# Patient Record
Sex: Male | Born: 1935 | Race: White | Hispanic: No | Marital: Married | Smoking: Never smoker
Health system: Southern US, Community
[De-identification: ages and names within clinical notes are randomized; demographics above are authoritative.]

## PROBLEM LIST (undated history)

## (undated) DIAGNOSIS — K219 Gastro-esophageal reflux disease without esophagitis: Secondary | ICD-10-CM

## (undated) DIAGNOSIS — I4891 Unspecified atrial fibrillation: Secondary | ICD-10-CM

## (undated) DIAGNOSIS — R600 Localized edema: Secondary | ICD-10-CM

## (undated) DIAGNOSIS — I429 Cardiomyopathy, unspecified: Secondary | ICD-10-CM

## (undated) DIAGNOSIS — E785 Hyperlipidemia, unspecified: Secondary | ICD-10-CM

## (undated) DIAGNOSIS — E119 Type 2 diabetes mellitus without complications: Secondary | ICD-10-CM

## (undated) DIAGNOSIS — F0390 Unspecified dementia without behavioral disturbance: Secondary | ICD-10-CM

## (undated) DIAGNOSIS — G20 Parkinson's disease: Secondary | ICD-10-CM

## (undated) DIAGNOSIS — I10 Essential (primary) hypertension: Secondary | ICD-10-CM

## (undated) HISTORY — DX: Localized edema: R60.0

## (undated) HISTORY — DX: Gastro-esophageal reflux disease without esophagitis: K21.9

## (undated) HISTORY — DX: Parkinson's disease: G20

## (undated) HISTORY — PX: OTHER SURGICAL HISTORY: SHX169

## (undated) HISTORY — DX: Hyperlipidemia, unspecified: E78.5

## (undated) HISTORY — PX: CATARACT EXTRACTION: SUR2

## (undated) HISTORY — PX: TOTAL KNEE ARTHROPLASTY: SHX125

## (undated) HISTORY — DX: Essential (primary) hypertension: I10

## (undated) HISTORY — DX: Type 2 diabetes mellitus without complications: E11.9

## (undated) HISTORY — DX: Cardiomyopathy, unspecified: I42.9

## (undated) HISTORY — DX: Unspecified atrial fibrillation: I48.91

---

## 2007-05-31 ENCOUNTER — Ambulatory Visit: Payer: Self-pay | Admitting: Emergency Medicine

## 2008-02-26 ENCOUNTER — Ambulatory Visit: Payer: Self-pay | Admitting: Family Medicine

## 2008-02-26 ENCOUNTER — Other Ambulatory Visit: Payer: Self-pay

## 2008-02-29 ENCOUNTER — Other Ambulatory Visit: Payer: Self-pay

## 2008-02-29 ENCOUNTER — Inpatient Hospital Stay: Payer: Self-pay | Admitting: Internal Medicine

## 2008-06-06 ENCOUNTER — Ambulatory Visit: Payer: Self-pay | Admitting: Cardiovascular Disease

## 2008-06-06 ENCOUNTER — Encounter (INDEPENDENT_AMBULATORY_CARE_PROVIDER_SITE_OTHER): Payer: Self-pay | Admitting: Neurosurgery

## 2008-06-06 ENCOUNTER — Inpatient Hospital Stay (HOSPITAL_COMMUNITY): Admission: RE | Admit: 2008-06-06 | Discharge: 2008-06-08 | Payer: Self-pay | Admitting: Neurosurgery

## 2008-10-14 ENCOUNTER — Encounter: Admission: RE | Admit: 2008-10-14 | Discharge: 2008-10-14 | Payer: Self-pay | Admitting: Neurosurgery

## 2009-06-05 IMAGING — CR DG CERVICAL SPINE 2 OR 3 VIEWS
3 series · 3 of 3 positions shown · non-contrast
Comparison: None

CLINICAL DATA: History of C7-T1 fusion, follow-up

CERVICAL SPINE - 2-3 VIEW

[w c-spine lat]
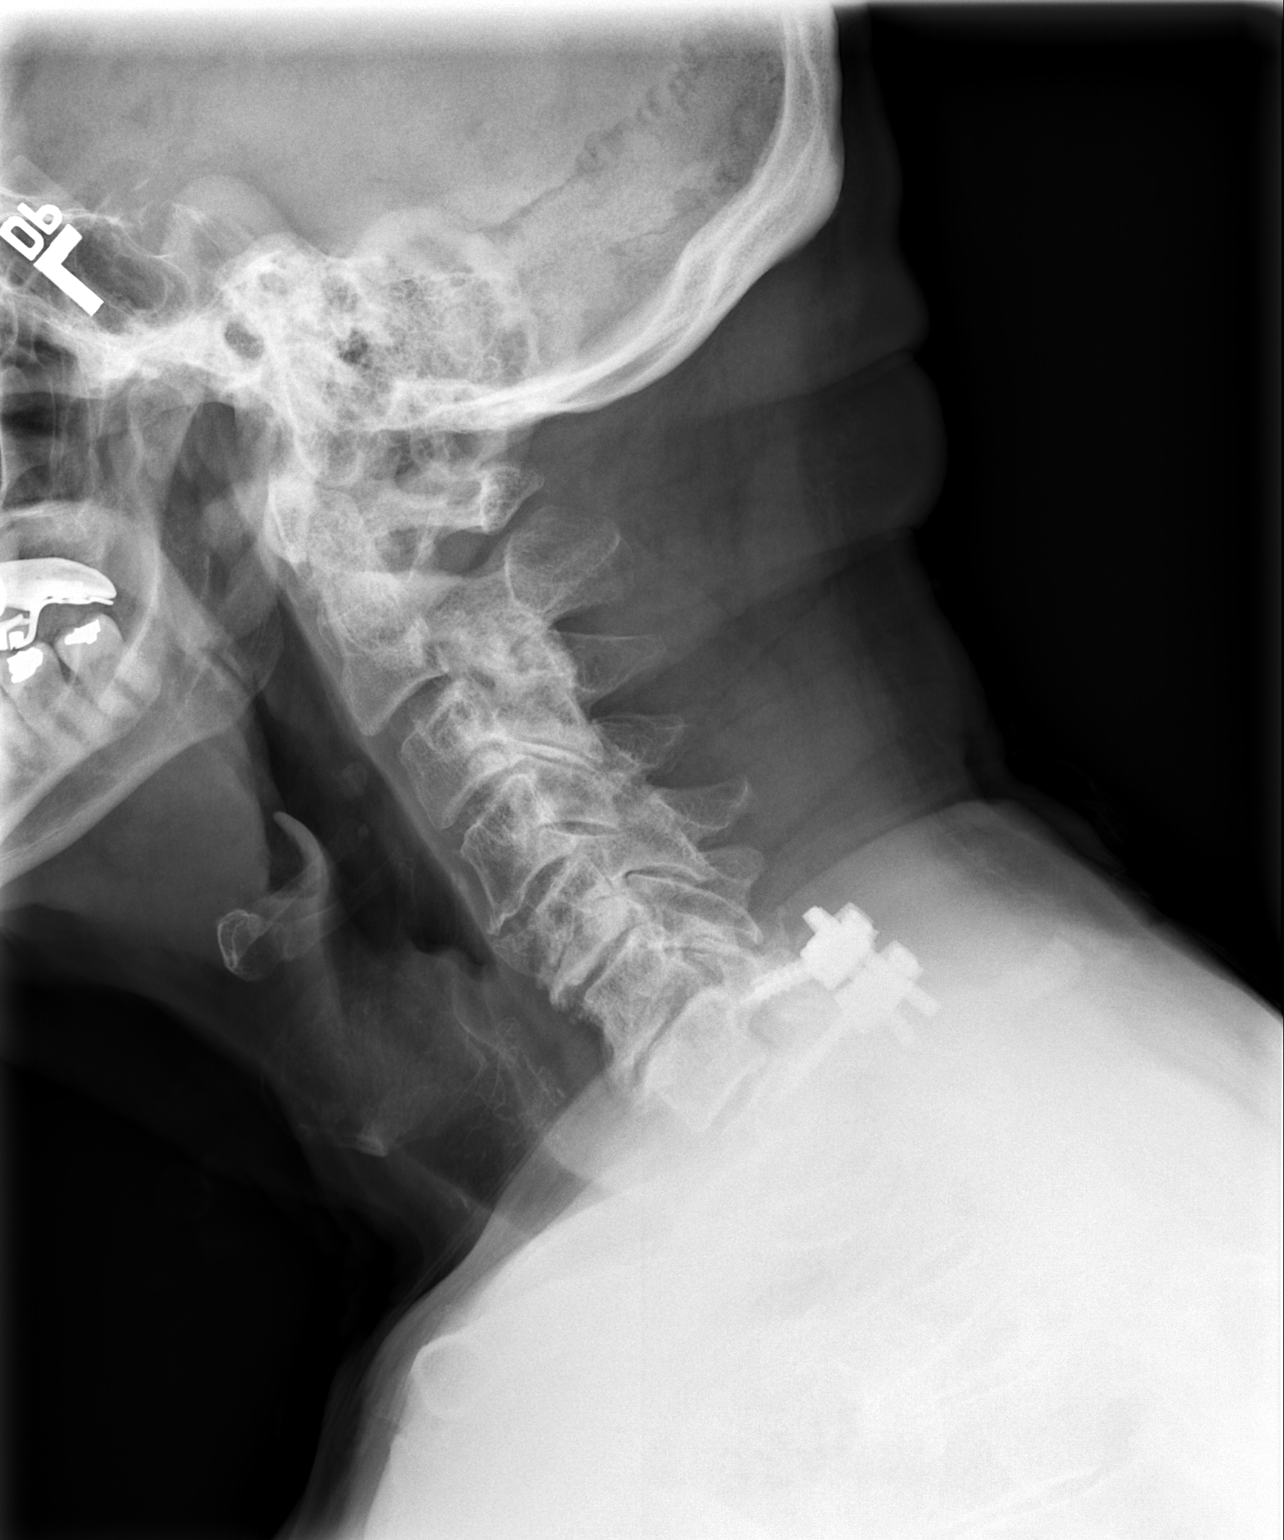

[w swimmers view *]
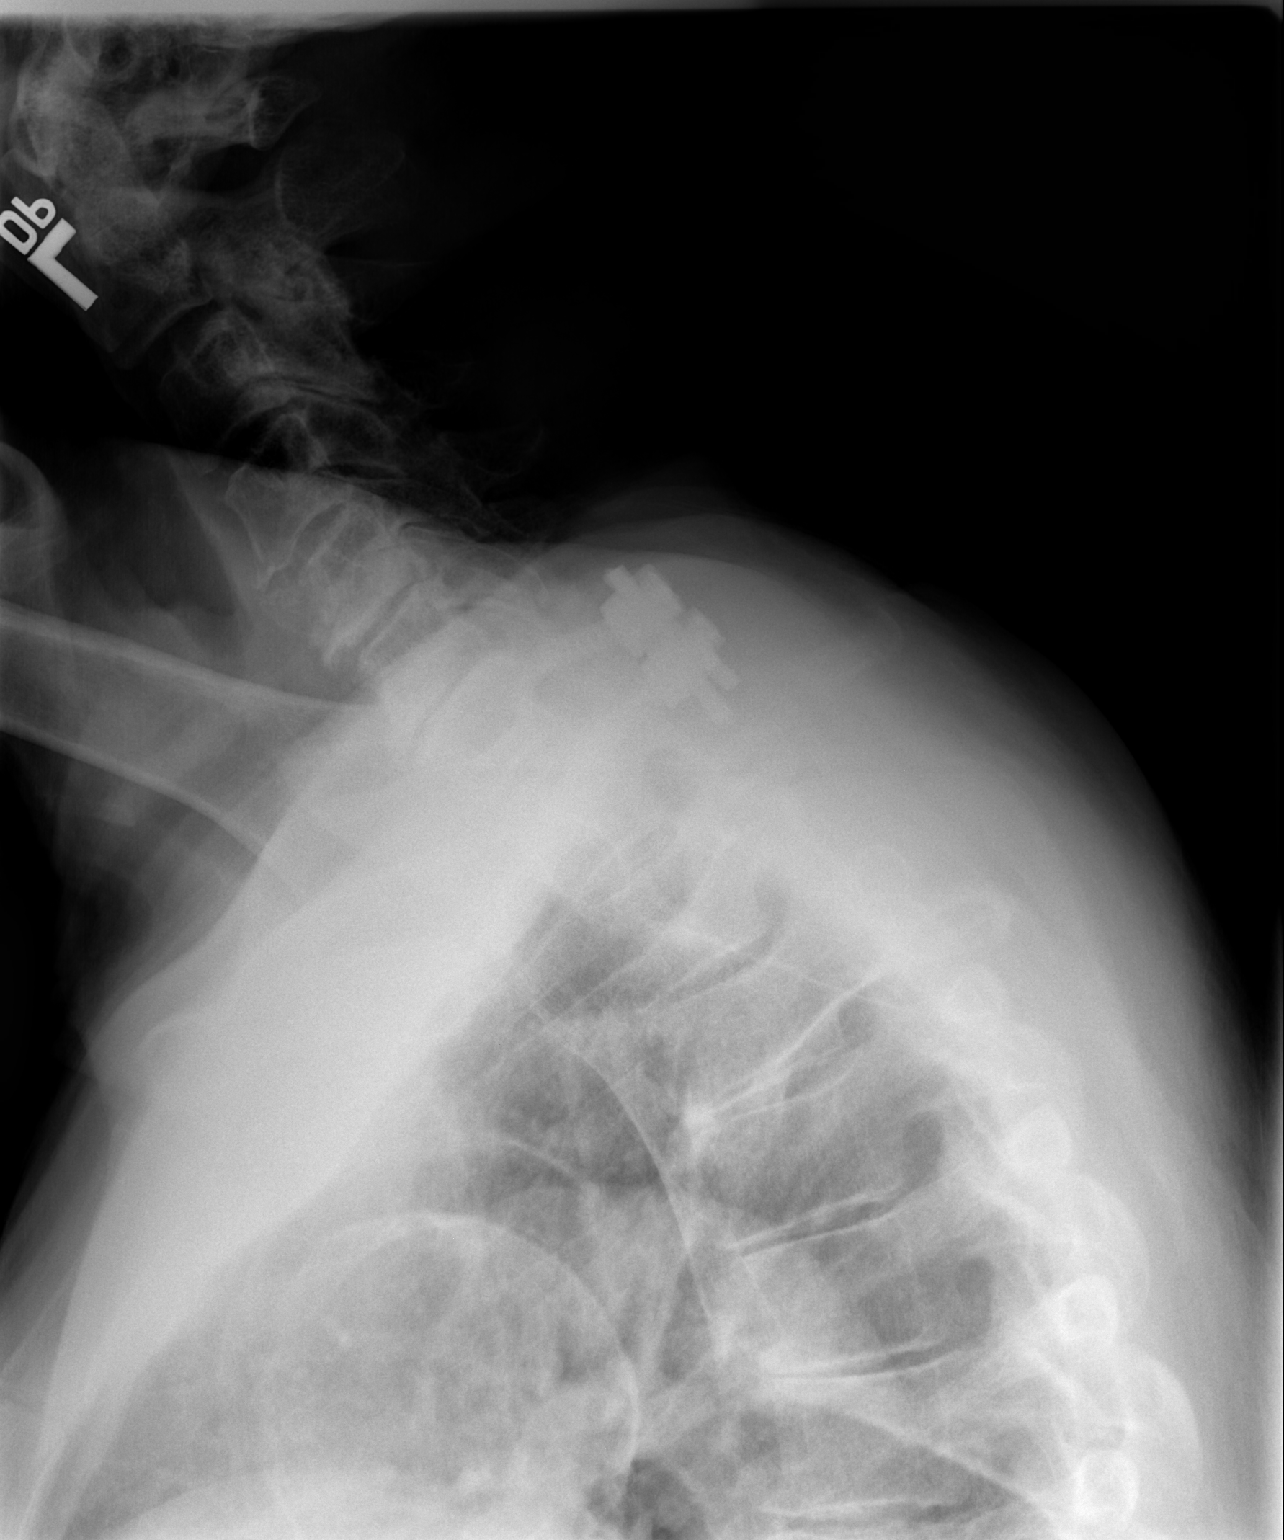

[w c-spine a.p. *]
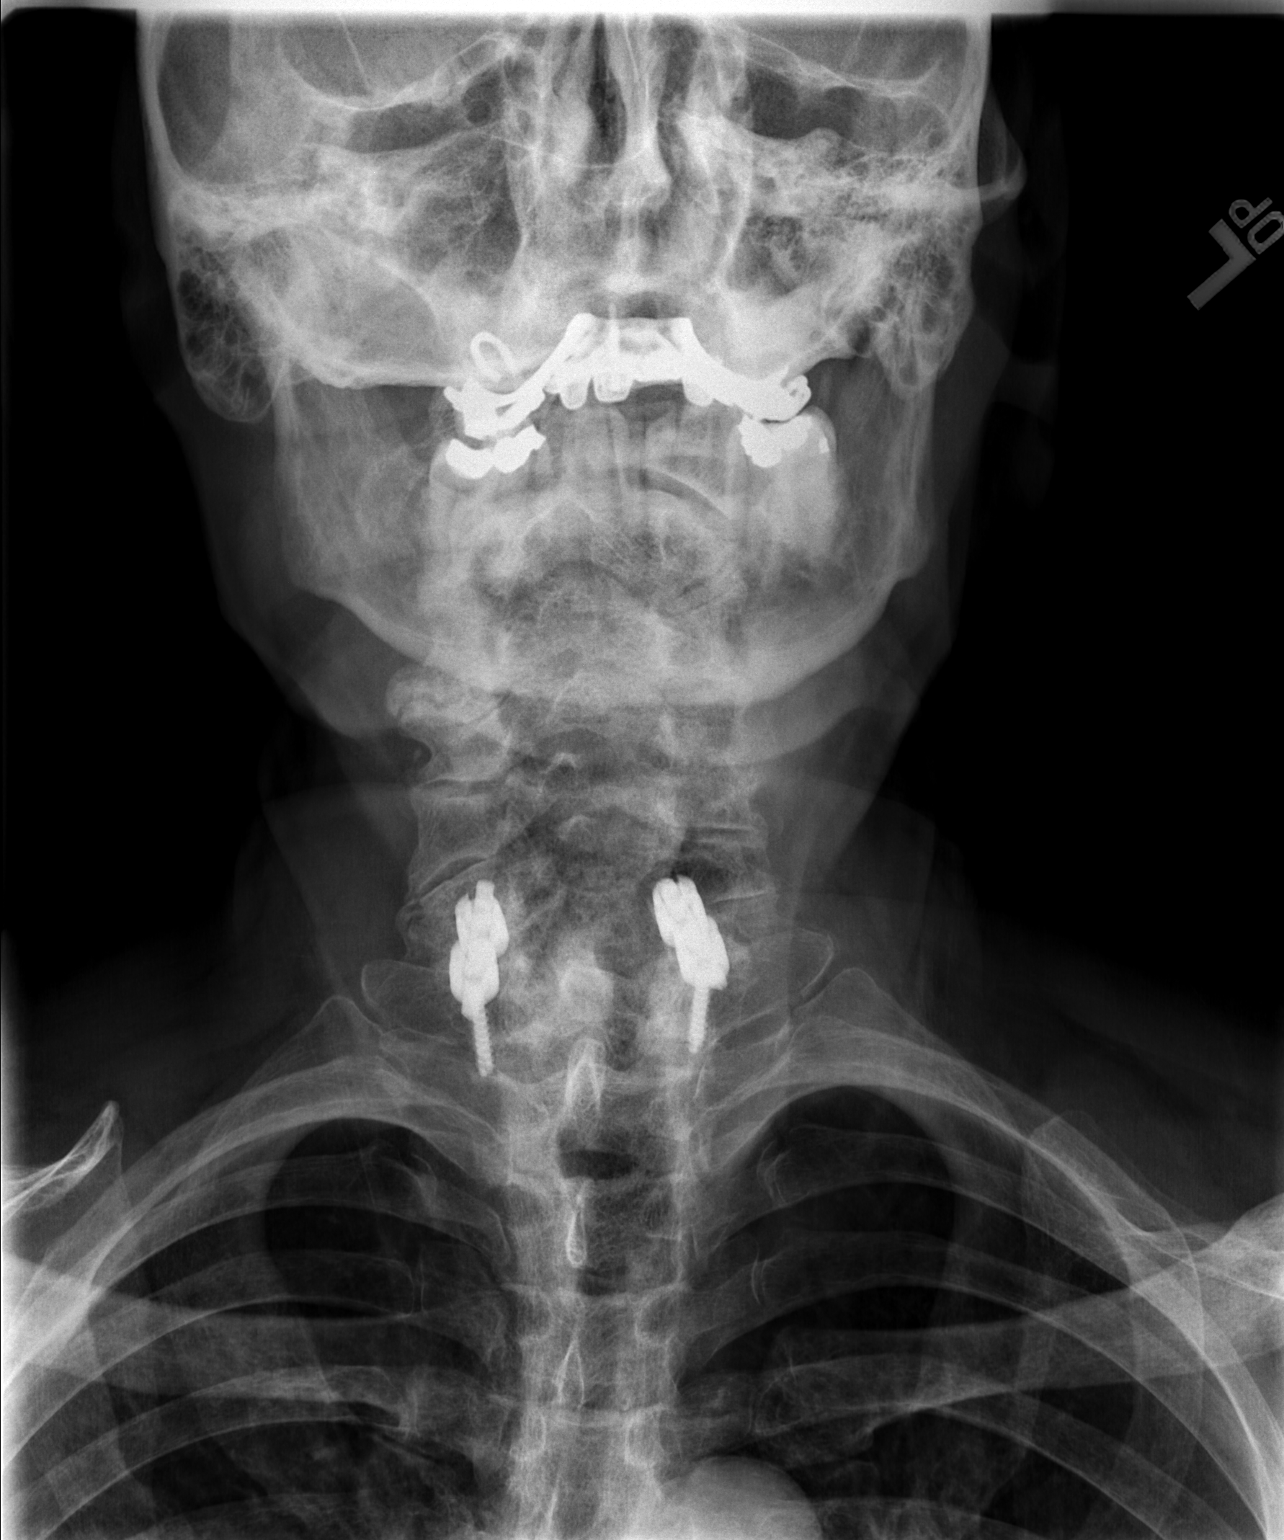

[3 of 3 positions shown; findings below may reference images not displayed]

FINDINGS: Posterior fusion at C7-T1 is noted.  The cervical
vertebrae are slightly straightened in alignment.  There is diffuse
degenerative disc disease from the C3-T1 with loss of disc space,
sclerosis, and spurring.  No prevertebral soft tissue swelling is
seen.
IMPRESSION: Posterior fusion at C7-T1 with straightened alignment.  Diffuse
degenerative disc disease.

## 2010-04-08 ENCOUNTER — Ambulatory Visit: Payer: Self-pay | Admitting: Family Medicine

## 2010-04-23 ENCOUNTER — Observation Stay: Payer: Self-pay | Admitting: Internal Medicine

## 2010-05-14 ENCOUNTER — Ambulatory Visit: Payer: Self-pay | Admitting: Gastroenterology

## 2010-06-19 ENCOUNTER — Emergency Department: Payer: Self-pay | Admitting: Emergency Medicine

## 2011-02-09 NOTE — Consult Note (Signed)
NAMEJAROD, BOZZO NO.:  [id number]   MEDICAL RECORD NO.:  [id number]          PATIENT TYPE:  OIB   LOCATION:  4714                         FACILITY:  [facility]   PHYSICIAN:  Noralyn Pick. Eden Emms, MD, FACCDATE OF BIRTH:  1930/07/18   DATE OF CONSULTATION:  06/07/2008  DATE OF DISCHARGE:                                 CONSULTATION   A 77 year old patient who is postop and cervical neck surgery by Dr.  Gerlene Fee.  We were asked to help evaluate in regards to his AFib.  The  patient is in chronic AFib.  He has been on Coumadin for over a year and  there have been no attempts at cardioversion.  He has seen Dr. Lady Gary and  Dr. Darrold Junker preoperatively.  He had a nonischemic adenosine Myoview  and has normal LV function.   There is a history of a murmur, but I do not have an echocardiogram on  the patient.   He has had a fairly uneventful perioperative course.   His blood pressure has been a little bit labile and his heart rate as  well.  When I went to see the patient, he was on an IV Cardizem drip.  Unfortunately, none of his home medicines have been restarted, in  particular his hydrochlorothiazide, lisinopril, and propranolol have not  been restarted.   He also has significant Parkinson disease and has not received his  Sinemet.  The patient is asymptomatic.  He has some pain in his neck,  but he is not having chest pain, palpitations, PND, or orthopnea.  There  is no significant diaphoresis.   Reading through Dr. Trudee Grip note, it appears that it is okay to  restart his Coumadin.  The patient normally gets his INR checked at the  [facility].   REVIEW OF SYSTEMS:  Otherwise negative.   PAST MEDICAL HISTORY:  1. Parkinson disease, on Sinemet.  2. 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  3. Status post right knee replacement.  4. History of hypertension.  5. History of chronic AFib.  6. History of diabetes.  7. History of  peripheral neuropathy.   ALLERGIES:  He denies any allergies.   SOCIAL HISTORY:  The patient lives in [place].  He is retired.  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  His wife was with him.  His activities  are limited by his Parkinson disease.   FAMILY HISTORY:  Noncontributory.   MEDICATIONS AT HOME:  1. Lantus 10 units at night.  2. Lisinopril 20 a day.  3. Hydrochlorothiazide 25 a day.  4. Propranolol 80 a day.  5. Simvastatin 40 a day.  6. Coumadin as directed.  I believe he was on 3 mg day.  7. Glipizide 10 mg a day.  8. Sinemet CR 50/200 b.i.d.  9. Neurontin 300 a day and pain medicine.   PHYSICAL EXAMINATION:  GENERAL:  Remarkable for an elderly white male  with a tremulous right upper extremity.  He has a cervical neck collar  on.  VITAL SIGNS:  His current blood pressure is  150/91.  He is in AFib at a  rate of 105, temperature is 97.9, and room air sats were 98%.  HEENT:  Unremarkable.  NECK:  Carotids were normal without bruit.  No lymphadenopathy.  thyromegaly, or JVP elevation.  LUNGS:  Clear. Good diaphragmatic motion.  No wheezing.  S1 and S2 with  systolic ejection murmur.  PMI normal.  ABDOMEN:  Benign.  Bowel sounds positive.  No AAA.  No tenderness.  No  bruit.  No hepatosplenomegaly or hepatojugular reflux.  EXTREMITIES:  Distal pulses intact with no edema.  NEUROLOGIC:  Nonfocal except for his Parkinson symptoms, particularly  with tremor in the right upper extremity status post right knee  replacement.   LABORATORY DATA:  His monitor shows atrial fibrillation with variable  rates.   His potassium is 3.7, BUN is 13, and creatinine is 1.  His Protime is  1.1.  CBC shows a hematocrit of 44.   Two-view chest done on June 05, 2008 showed mild COPD.   IMPRESSION:  1. Atrial fibrillation, chronic.  The patient was not placed back on      his propranolol.  His IV Cardizem drip can be stopped.  We will      give him as 80 of long-acting propranolol.   We can supplement him      with oral Cardizem p.o.  Pain control will be important.  It is      normal for atrial fibrillation rates to be variable in the      perioperative phase, particularly when previous atrioventricular      nodal blocking drugs were withheld.  2. Hypertension.  Again, the patient needs to get his lisinopril.      Continue low-salt diet.  We would continue to hold his diuretic      until p.o. intake improves.  3. Hypercholesterolemia.  Continue simvastatin 20 mg a day.  No      previous coronary disease with negative stress test prior to      surgery.  4. Diabetes, poorly controlled.  I believe he has been covered with a      sliding scale, but did not get his evening Lantus.  5. Re-anticoagulation.  I will write for his Coumadin tonight.  He      does not need a heparin overlap.   As long as we can get him off his IV Cardizem and his blood pressure is  reasonably controlled, he can be discharged in the morning and followup  with Dr. Darrold Junker in [place].      Noralyn Pick. Eden Emms, MD, [facility]  Electronically Signed     PCN/MEDQ  D:  06/07/2008  T:  06/08/2008  Job:  [id number]   cc:   Marcina Millard, M.D.

## 2011-02-09 NOTE — Op Note (Signed)
Jacob Calderon, SEXSON NO.:  [id number]   MEDICAL RECORD NO.:  [id number]          PATIENT TYPE:  OIB   LOCATION:  4714                         FACILITY:  [facility]   PHYSICIAN:  Reinaldo Meeker, M.D. DATE OF BIRTH:  [birth year]   DATE OF PROCEDURE:  06/06/2008  DATE OF DISCHARGE:                               OPERATIVE REPORT   PREOPERATIVE DIAGNOSIS:  Synovial cyst at C7-T1.   POSTOPERATIVE DIAGNOSIS:  Synovial cyst at C7-T1.   PROCEDURE:  C7-T1 laminectomy with removal of synovial cyst followed by  nonsegmental instrumentation with lateral mass screws C7-T1 with vertex  system followed by nonsegmental posterolateral fusion C7-T1.   SURGEON:  Reinaldo Meeker, MD   ASSISTANT:  Tia Alert, MD   PROCEDURE IN DETAIL:  After placing three-point pin fixation in the  prone position, the patient's neck was shaved, prepped, and draped in  the usual sterile fashion.  Localizing x-rays were taken prior to the  incision to identify the appropriate level.  Midline incision was made  above the spinous processes of C6, C7, and T1.  Using Bovie cautery, the  incision was carried down to the spinous processes.  Subperiosteal  dissection was then carried out bilaterally on the spinous processes and  lamina facet joint.  A self-retaining retractor was placed for exposure.  X-rays showed the approach to the appropriate level.  Spinous process of  C7 and T1 removed.  Complete laminectomy was then performed at C7 and T1  as well as the inferior edge of C6 by using a small Kerrison punch and  high-speed drill.  A large laminectomy was performed so that we could  identify normal dura, which we were able to do.  We then began to  dissect the adherent cyst and adhesions away from the right side towards  the left with a large mass was identified.  This was dissected free then  removed in a piecemeal fashion.  A foraminal decompression of the C8  nerve roots bilaterally was then  carried out, and at this time,  inspection was carried out in all directions for any evidence of  residual compression and none could be identified.  Large amounts of  irrigation were carried out.  At this time, lateral mass screws were  placed.  Small drill hole entry point was placed followed by using the  drill through the guide.  A 40-mm depth was used at C7 and 20-mm depth  was used at T1.  Tapping was then carried out followed by placing of the  appropriately length screws.  A fluoroscopy showed it to be in excellent  position, but was hard to see on the lateral, but the AP showed it to be  in excellent position.  An appropriate rod was then cut and then secured  to the screws with the top loading nuts.  These were then tightened down  to the appropriate tightness with a counter torque being performed.  High-speed drill was used to decorticate the lateral mass and facet  joint at C7 and T1.  A mixture of OsteoSet Plus and autologous  bone was  placed in this area for the posterolateral fusion.  At this time, large  amounts of irrigation were carried out and bleeding controlled with  bipolar coagulation and Gelfoam.  The wound was then closed in multiple layers with Vicryl in the muscle  fascia and subcutaneous subcuticular tissues, and Steri-Strips were  placed on the skin.  A sterile dressing was then applied.  The patient  was extubated and taken to the recovery room in stable condition.           ______________________________  Reinaldo Meeker, M.D.     ROK/MEDQ  D:  06/06/2008  T:  06/07/2008  Job:  [id number]

## 2011-06-30 LAB — BASIC METABOLIC PANEL
CO2: 31
Chloride: 101
Glucose, Bld: 231 — ABNORMAL HIGH
Potassium: 3.7
Sodium: 140

## 2011-06-30 LAB — CBC
HCT: 44.8
Hemoglobin: 15.5
MCHC: 34.6
RDW: 13.3

## 2011-06-30 LAB — GLUCOSE, CAPILLARY
Glucose-Capillary: 104 — ABNORMAL HIGH
Glucose-Capillary: 184 — ABNORMAL HIGH
Glucose-Capillary: 376 — ABNORMAL HIGH
Glucose-Capillary: 507
Glucose-Capillary: 79

## 2011-06-30 LAB — PROTIME-INR: Prothrombin Time: 16.2 — ABNORMAL HIGH

## 2011-06-30 LAB — GLUCOSE, RANDOM: Glucose, Bld: 438 — ABNORMAL HIGH

## 2011-08-10 ENCOUNTER — Ambulatory Visit: Payer: Self-pay

## 2012-10-15 ENCOUNTER — Emergency Department: Payer: Self-pay | Admitting: Emergency Medicine

## 2012-10-15 LAB — URINALYSIS, COMPLETE
Glucose,UR: 50 mg/dL (ref 0–75)
Protein: 30
RBC,UR: 4 /HPF (ref 0–5)

## 2012-10-15 LAB — CBC
HGB: 16.5 g/dL (ref 13.0–18.0)
MCH: 29.9 pg (ref 26.0–34.0)
MCHC: 34.6 g/dL (ref 32.0–36.0)
Platelet: 214 10*3/uL (ref 150–440)
RDW: 13.9 % (ref 11.5–14.5)

## 2012-10-15 LAB — BASIC METABOLIC PANEL
Co2: 26 mmol/L (ref 21–32)
EGFR (Non-African Amer.): 55 — ABNORMAL LOW
Potassium: 4.2 mmol/L (ref 3.5–5.1)
Sodium: 138 mmol/L (ref 136–145)

## 2013-08-31 ENCOUNTER — Ambulatory Visit: Payer: Self-pay | Admitting: Gastroenterology

## 2014-12-20 ENCOUNTER — Ambulatory Visit: Payer: Self-pay | Admitting: Family Medicine

## 2014-12-20 ENCOUNTER — Emergency Department: Payer: Self-pay | Admitting: Emergency Medicine

## 2015-03-18 ENCOUNTER — Ambulatory Visit (INDEPENDENT_AMBULATORY_CARE_PROVIDER_SITE_OTHER): Payer: Medicare Other | Admitting: Neurology

## 2015-03-18 ENCOUNTER — Encounter: Payer: Self-pay | Admitting: Neurology

## 2015-03-18 VITALS — BP 138/82 | HR 85 | Resp 16 | Wt 148.0 lb

## 2015-03-18 DIAGNOSIS — G20 Parkinson's disease: Secondary | ICD-10-CM | POA: Diagnosis not present

## 2015-03-18 DIAGNOSIS — K5901 Slow transit constipation: Secondary | ICD-10-CM

## 2015-03-18 DIAGNOSIS — G253 Myoclonus: Secondary | ICD-10-CM

## 2015-03-18 DIAGNOSIS — F0280 Dementia in other diseases classified elsewhere without behavioral disturbance: Secondary | ICD-10-CM

## 2015-03-18 DIAGNOSIS — K117 Disturbances of salivary secretion: Secondary | ICD-10-CM | POA: Diagnosis not present

## 2015-03-18 NOTE — Progress Notes (Signed)
Jacob Calderon was seen today in the movement disorders clinic for neurologic consultation at the request of Jacob Johns Cleotis Nipper, MD.  Dr. Geralyn Calderon records were reviewed.  The patient is accompanied by his wife, sister and son who supplement the history.  The patient was diagnosed with Parkinson's disease approximately 18 years ago.  He was diagnosed and treated first in [place].  His first symptom was right hand tremor, but tremor has progressed over the course of time and now involves both hands.  Tremor has become more disabling for him.  He was put on carbidopa/levodopa 50/200 tid from the beginning and his wife states that he has never been on the carbidopa/levodopa 25/100 tid.   On 01/28/2015 his carbidopa/levodopa CR 50/200 was increased from 3 times per day to 4 times per day (8am, 12, 5, 10pm which is bedtime).  He takes medication with the meals. Dr. Malvin Johns was hoping that increasing the medication would have helped tremor and perhaps gait stability, but it did not and so he presented for a second opinion at the request of Dr. Malvin Johns.  His wife states that his gait is getting worse and it sounds like he is having increased freezing.  His wife states that his last Calderon, OT was with Jacob Calderon in march.  The Calderon was helpful but the OT frustrated the patient.  He was having "entire bed jerking" at night and was placed on VPA 125 mg bid (morning and night) on 02/27/15 and it seemed to help this symptom.  Only fam hx of PD is paternal uncle.    The patient has also had memory change for about 1 year.  Dr. Malvin Johns did an MMSE on 01/28/2015 which demonstrated MMSE of 15/30.  He started Aricept that day.  His B12 was normal at 491.  TSH was normal at 5.209.  Specific Symptoms:  Tremor: Yes.   Voice: hypophonic Sleep: doing better  Vivid Dreams:  No.  Acting out dreams:  No. Wet Pillows: No. Postural symptoms:  Yes.    Falls?  Yes.   (last fall in April, 2016 and had several falls in a row and hit head  with one and examined at [facility] and had 3 staples and told CT brain negative) Bradykinesia symptoms: shuffling gait, slow movements, drooling while awake, difficulty getting out of a chair and difficulty regaining balance Loss of smell:  No. Loss of taste:  No. but has lost appetite Urinary Incontinence:  Yes.   (rarely) Difficulty Swallowing:  occasionally Handwriting, micrographia: Yes.   Trouble with ADL's:  Yes.    Trouble buttoning clothing: Yes.   Depression:  Yes.   but better with prozac Memory changes:  Yes.   but better over the last month or so Hallucinations:  Yes.  , rarely  visual distortions: Yes.   N/V:  No. Lightheaded:  Some after first standing  Syncope: No. Diplopia:  No. Dyskinesia:  No.  Neuroimaging has previously been performed.  It is not available for my review today.  PREVIOUS MEDICATIONS: Sinemet CR  ALLERGIES:   Allergies  Allergen Reactions  . Hydrocodone-Acetaminophen     Other reaction(s): Hallucination    CURRENT MEDICATIONS:  Outpatient Encounter Prescriptions as of 03/18/2015  Medication Sig  . atorvastatin (LIPITOR) 20 MG tablet Take 20 mg by mouth daily.  . carbidopa-levodopa (SINEMET CR) 50-200 MG per tablet Take 1 tablet by mouth 4 times a day  . clindamycin (CLINDAGEL) 1 % gel Apply topically 2 (two) times  daily.  . dabigatran (PRADAXA) 150 MG CAPS capsule Take 150 mg by mouth 2 (two) times daily.  Marland Kitchen donepezil (ARICEPT) 5 MG tablet Take 5 mg by mouth at bedtime.  . gabapentin (NEURONTIN) 300 MG capsule Take 300 mg by mouth 3 (three) times daily.  . hydrochlorothiazide (HYDRODIURIL) 25 MG tablet Take 25 mg by mouth 2 (two) times daily.  . insulin glargine (LANTUS) 100 UNIT/ML injection Inject into the skin at bedtime. 18 units at bedtime  . insulin lispro (HUMALOG KWIKPEN) 100 UNIT/ML KiwkPen Inject into the skin. Sliding Scale  . lisinopril (PRINIVIL,ZESTRIL) 10 MG tablet Take 10 mg by mouth daily.  . metoprolol succinate (TOPROL-XL)  50 MG 24 hr tablet Take 50 mg by mouth daily. Take with or immediately following a meal.  . potassium chloride (K-DUR) 10 MEQ tablet Take 10 mEq by mouth daily.   No facility-administered encounter medications on file as of 03/18/2015.    PAST MEDICAL HISTORY:   Past Medical History  Diagnosis Date  . Diabetes type 2, controlled   . Hypertension   . Parkinson disease   . GERD (gastroesophageal reflux disease)   . Hyperlipidemia   . Cardiomyopathy   . Pedal edema   . Atrial fibrillation     PAST SURGICAL HISTORY:   Past Surgical History  Procedure Laterality Date  . Total knee arthroplasty      Right  . Cataract extraction      Bilateral  . Other surgical history      Cyst Removal C7-T1    SOCIAL HISTORY:   History   Social History  . Marital Status: Married    Spouse Name: N/A  . Number of Children: 1  . Years of Education: N/A   Occupational History  . Retired     Product/process development scientist   Social History Main Topics  . Smoking status: Never Smoker   . Smokeless tobacco: Never Used  . Alcohol Use: No     Comment: no EtOH x 60 years  . Drug Use: No  . Sexual Activity: Not on file   Other Topics Concern  . Not on file   Social History Narrative    FAMILY HISTORY:   Family Status  Relation Status Death Age  . Mother Deceased     cerebral aneurysm  . Father Deceased     ? AAA  . Sister Alive     healthy  . Brother Deceased     DM, renal failure  . Child Alive     healthy    ROS:  A complete 10 system review of systems was obtained and was unremarkable apart from what is mentioned above.  PHYSICAL EXAMINATION:    VITALS:   Filed Vitals:   03/18/15 1225  BP: 138/82  Pulse: 85  Resp: 16  Weight: 148 lb (67.132 kg)  SpO2: 98%    GEN:  The patient appears stated age and is in NAD. HEENT:  Normocephalic, atraumatic.  The mucous membranes are moist. The superficial temporal arteries are without ropiness or tenderness. CV:  RRR Lungs:   CTAB Neck/HEME:  There are no carotid bruits bilaterally.  Neurological examination:  Orientation: The patient got an 11/30 on his MoCA today. Cranial nerves: There is good facial symmetry.  There is mild facial hypomimia.  Funduscopic exam is attempted, but the disc margins are not well visualized.  Pupils are reactive bilaterally.   Extraocular muscles are intact. The visual fields are full to confrontational testing. The speech  is fluent and clear.  He is somewhat hypophonic.  He has minimal difficulty with the guttural sounds.  Soft palate rises symmetrically and there is no tongue deviation. Hearing is intact to conversational tone. Sensation: Sensation is intact to light and pinprick throughout (facial, trunk, extremities). Vibration is intact at the bilateral big toe. There is no extinction with double simultaneous stimulation. There is no sensory dermatomal level identified. Motor: Strength is 5/5 in the bilateral upper and lower extremities.   Shoulder shrug is equal and symmetric.  There is no pronator drift. Deep tendon reflexes: Deep tendon reflexes are 0-1/4 at the bilateral biceps, triceps, brachioradialis, patella and achilles. Plantar responses are downgoing bilaterally.  Movement examination: Tone: There is normal tone in the bilateral upper extremities.  The tone in the lower extremities is normal.  Abnormal movements: There is bilateral UE resting tremor, right more than left Coordination:  There is mild decremation with RAM's, seen mostly with alternating of supination/pronation of the forearm bilaterally Gait and Station: The patient has difficulty arising out of a deep-seated chair without the use of the hands.  He was given a walker to ambulate (came in in a wheelchair).  The patient's stride length is fairly good with a walker and he walked quickly, with a mildly stooped posture.  He actually turned quite well.    ASSESSMENT/PLAN:  1.  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  Talked to them about the fact that I likely would not try further medication for tremor if levodopa didn't work for it as likely would have more SE than benefit.     -Looked fairly well today but may benefit from trying to transition to the IR formulation of carbidopa/levodopa 25/100 (the equivalent dosage to what he is taking), 2 po tid and then take the carbidopa/levodopa 50/200 at night.  I told them that it is possible that he could have more side effects with this (more hallucinations, lightheadedness) but may be worth a trial.  Also talked about trying to keep the medication away from meals/protein source.    -recommended course of Calderon again  2.  ? Myoclonus  -Wife describing possible myoclonus at night, which may be from his gabapentin.  I told the patient and his wife that it may benefit him to try and lower the dosage.  If that helped, then perhaps he would be able to get off of the Depakote, which I presume was prescribed for the treatment of myoclonus.  The Depakote has helped, but can worsen tremor so it would be nice if he could try and decrease the gabapentin.  He is currently on 300 mg 3 times per day. 3.  Sialorrhea  -Overall fairly rare and non-bothersome.  If increases in the future, consideration can be given to Myobloc. 4.  Constipation  -The patient was given a copy of the rancho recipe. 5.  History of multiple skin cancers, including a remote history of melanoma on the shoulder  -Discussed the relationship of melanoma to Parkinson's disease.  He is actively seeing dermatology and just saw his dermatologist in April. 6.  Parkinsons dementia  -Was just started on Aricept, which I agree with.  Limiting medications would be of benefit and we discussed this in detail today.  We also discussed the value of exercise. 7.  The patient is going to follow-up with Dr.  Malvin Johns to  discuss the above recommendations.  I did not change anything today as this was a true second opinion/consultation and he is going to be following up with Dr. Malvin Johns.  Much greater than 50% of the 60 minute visit spent in counseling with the patient and his family.

## 2015-03-18 NOTE — Patient Instructions (Signed)
Constipation and Parkinson's disease:  1.Rancho recipe for constipation in Parkinsons Disease:  -1 cup of bran, 2 cups of applesauce in 1 cup of prune juice 2.  Increase fiber intake (Metamucil,vegetables) 3.  Regular, moderate exercise can be beneficial. 4.  Avoid medications causing constipation, such as medications like antacids with calcium or magnesium 5.  Laxative overuse should be avoided. 6.  Stool softeners (Colace) can help with chronic constipation.  

## 2015-03-19 NOTE — Progress Notes (Signed)
Note faxed to both doctors. 

## 2015-04-30 ENCOUNTER — Telehealth: Payer: Self-pay | Admitting: Neurology

## 2015-04-30 NOTE — Telephone Encounter (Signed)
I wrote several recommendations last visit to dr. Malvin Johns but the best would be to have them talk with Dr. Malvin Johns about trying to change from the CR to the IR preparation.  His consult with me was a true second opinion and they are continuing to f/u with Dr. Malvin Johns so best to discuss recommendations with him.  PT may be of value as well.

## 2015-04-30 NOTE — Telephone Encounter (Signed)
Pt's wife if concerned about the increase in his falls/ is this due to the parkinsons dz/ is this normal/ or is this something else going on with him? [phone]

## 2015-04-30 NOTE — Telephone Encounter (Signed)
Spoke with patient's wife and she states that [place] patient had 6 falls, in July he has had 13. She states that he is having a lot of episodes of freezing up, his leg starts shaking, and then he will fall backwards. Most of the time she is able to catch him but a few times he has fallen to the ground. He is doing better with his tremors, no longer waking up at 3 am shaking. His memory is better as well. No other symptoms. She is very concerned about these falls, because she is having a hard time catching him now. Please advise.

## 2015-04-30 NOTE — Telephone Encounter (Signed)
Patient's wife made aware. They will discuss with Dr Malvin Johns.

## 2015-05-08 ENCOUNTER — Other Ambulatory Visit: Payer: Self-pay

## 2015-05-08 ENCOUNTER — Emergency Department: Payer: Medicare Other

## 2015-05-08 ENCOUNTER — Emergency Department
Admission: EM | Admit: 2015-05-08 | Discharge: 2015-05-08 | Disposition: A | Discharge status: Home / Self Care | Payer: Medicare Other | Attending: Emergency Medicine | Admitting: Emergency Medicine

## 2015-05-08 DIAGNOSIS — G20 Parkinson's disease: Secondary | ICD-10-CM

## 2015-05-08 DIAGNOSIS — G3183 Dementia with Lewy bodies: Secondary | ICD-10-CM | POA: Diagnosis not present

## 2015-05-08 DIAGNOSIS — R262 Difficulty in walking, not elsewhere classified: Secondary | ICD-10-CM

## 2015-05-08 DIAGNOSIS — Z792 Long term (current) use of antibiotics: Secondary | ICD-10-CM | POA: Diagnosis not present

## 2015-05-08 DIAGNOSIS — F0280 Dementia in other diseases classified elsewhere without behavioral disturbance: Secondary | ICD-10-CM | POA: Insufficient documentation

## 2015-05-08 DIAGNOSIS — I10 Essential (primary) hypertension: Secondary | ICD-10-CM | POA: Insufficient documentation

## 2015-05-08 DIAGNOSIS — I4891 Unspecified atrial fibrillation: Secondary | ICD-10-CM | POA: Insufficient documentation

## 2015-05-08 DIAGNOSIS — Z7901 Long term (current) use of anticoagulants: Secondary | ICD-10-CM | POA: Insufficient documentation

## 2015-05-08 DIAGNOSIS — Z794 Long term (current) use of insulin: Secondary | ICD-10-CM | POA: Diagnosis not present

## 2015-05-08 DIAGNOSIS — Z79899 Other long term (current) drug therapy: Secondary | ICD-10-CM | POA: Insufficient documentation

## 2015-05-08 DIAGNOSIS — R2689 Other abnormalities of gait and mobility: Secondary | ICD-10-CM | POA: Diagnosis not present

## 2015-05-08 DIAGNOSIS — E119 Type 2 diabetes mellitus without complications: Secondary | ICD-10-CM | POA: Insufficient documentation

## 2015-05-08 DIAGNOSIS — R531 Weakness: Secondary | ICD-10-CM | POA: Diagnosis present

## 2015-05-08 DIAGNOSIS — E876 Hypokalemia: Secondary | ICD-10-CM | POA: Diagnosis not present

## 2015-05-08 HISTORY — DX: Unspecified dementia, unspecified severity, without behavioral disturbance, psychotic disturbance, mood disturbance, and anxiety: F03.90

## 2015-05-08 LAB — URINALYSIS COMPLETE WITH MICROSCOPIC (ARMC ONLY)
Bacteria, UA: NONE SEEN
Bilirubin Urine: NEGATIVE
GLUCOSE, UA: NEGATIVE mg/dL
Hgb urine dipstick: NEGATIVE
LEUKOCYTES UA: NEGATIVE
Nitrite: NEGATIVE
Protein, ur: 30 mg/dL — AB
Specific Gravity, Urine: 1.023 (ref 1.005–1.030)
pH: 6 (ref 5.0–8.0)

## 2015-05-08 LAB — CBC
HEMATOCRIT: 48.7 % (ref 40.0–52.0)
HEMOGLOBIN: 16.7 g/dL (ref 13.0–18.0)
MCH: 30 pg (ref 26.0–34.0)
MCHC: 34.2 g/dL (ref 32.0–36.0)
MCV: 87.6 fL (ref 80.0–100.0)
Platelets: 222 10*3/uL (ref 150–440)
RBC: 5.56 MIL/uL (ref 4.40–5.90)
RDW: 14.3 % (ref 11.5–14.5)
WBC: 11.2 10*3/uL — ABNORMAL HIGH (ref 3.8–10.6)

## 2015-05-08 LAB — BASIC METABOLIC PANEL
Anion gap: 9 (ref 5–15)
BUN: 23 mg/dL — ABNORMAL HIGH (ref 6–20)
CALCIUM: 9.4 mg/dL (ref 8.9–10.3)
CHLORIDE: 99 mmol/L — AB (ref 101–111)
CO2: 32 mmol/L (ref 22–32)
CREATININE: 1.12 mg/dL (ref 0.61–1.24)
GFR calc non Af Amer: 59 mL/min — ABNORMAL LOW (ref >60)
Glucose, Bld: 125 mg/dL — ABNORMAL HIGH (ref 65–99)
Potassium: 3.1 mmol/L — ABNORMAL LOW (ref 3.5–5.1)
Sodium: 140 mmol/L (ref 135–145)

## 2015-05-08 LAB — TROPONIN I

## 2015-05-08 NOTE — ED Notes (Signed)
Pt comes into the ED via EMS from home with c/o generalized weakness that has increased over the past month.. Pt as a hx of parkinson's disease

## 2015-05-08 NOTE — Discharge Instructions (Signed)
Overall, test today or unremarkable. The potassium was only slightly low at 3.1. Urine was normal without sign of infection. He had CT did not show any acute change or bleed. We have ordered further evaluation by occupational therapy and physical therapy at your home. Follow-up with your regular doctors. Return to the emergency department if there are any further urgent concerns.  Parkinson Disease Parkinson disease is a disorder of the brain and spinal cord (central nervous system). The person will slowly lose the ability to control his or her body movements. This happens due to:  Damaged nerve cells.  Low levels of a certain brain chemical. HOME CARE  Exercise often.  Make time to rest during the day.  Take all medicine as told by your doctor.  Replace buttons and zippers with elastic and Velcro if getting dressed is difficult.  Put grab bars or rails in your home. This helps you to not fall.  Go to speech therapy or therapy to help you with daily activities (occupational therapy). Do this as told by your doctor.  Keep all doctor visits as told. GET HELP IF:  Your medicine does not help your symptoms.  You fall.  You have trouble swallowing or choke on your food. MAKE SURE YOU:  Understand these instructions.  Will watch your condition.  Will get help right away if you are not doing well or get worse. Document Released: 12/06/2011 Document Revised: 01/08/2013 Document Reviewed: 12/06/2011 [facility] Patient Information 2015 [place], [place]. This information is not intended to replace advice given to you by your health care provider. Make sure you discuss any questions you have with your health care provider.

## 2015-05-08 NOTE — ED Notes (Signed)
Pt discharged home after verbalizing understanding of discharge instructions; nad noted. 

## 2015-05-08 NOTE — Care Management Note (Signed)
Case Management Note  Patient Details  Name: Jacob Calderon MRN: [id number] Date of Birth: 03-11-1930  Subjective/Objective:     Asked by Dr Orvil Feil to set up home PT/ OT for the pt. Have spoken to pt. And family at bedsideThey have had [facility] before and requested them when list offered. I have called Jacob Calderon on phone to refer the pt. He has taken the pt. Info, and the MD is completing the face to face in [facility].  Family has been given the phone number for the agency.               Action/Plan:   Expected Discharge Date:                  Expected Discharge Plan:     In-House Referral:     Discharge planning Services     Post Acute Care Choice:    Choice offered to:     DME Arranged:    DME Agency:     HH Arranged:    HH Agency:     Status of Service:     Medicare Important Message Given:    Date Medicare IM Given:    Medicare IM give by:    Date Additional Medicare IM Given:    Additional Medicare Important Message give by:     If discussed at Long Length of Stay Meetings, dates discussed:    Additional Comments:  Berna Bue, RN 05/08/2015, 11:05 AM

## 2015-05-08 NOTE — ED Provider Notes (Addendum)
[facility] Emergency Department Provider Note  ____________________________________________  Time seen: 10:45 AM  I have reviewed the triage vital signs and the nursing notes.   HISTORY  Chief Complaint Weakness Falls Difficulty walking   HPI Jacob Calderon is a 81 y.o. male with a history of Parkinson's disease. He lives at home and is cared for by his wife. She gives in a detailed history of the patient's worsening progression with weakness and difficult in walking. He initially had a fall in March of this year. He has fallen with increasing frequency including a few falls yesterday and a fall this morning. She is having increasing difficulty helping him ambulate to the restroom. He does use a walker sometimes, but he freezes up and then sometimes falls. She is needing to use a wheelchair more and more. She spoke with neurology last night. They were unable to make an appointment with the patient's primary neurologist, Dr. Malvin Johns, due to his difficulties with his ambulation. She was told last night to bring the patient the emergency department for further evaluation due to his worsening condition.  The patient's wife is concerned about his falls. She does report he has hit his head 2 or 3 times in the past week with these falls. Of note, he does have a history of atrial fibrillation and is on Pradaxa.  The patient is alert, he speaks slowly, and is able to provide some of the history. He denies any headache, chest pain, shortness of breath, nausea or vomiting. He does appear generally weak and has a notable tremor consistent with Parkinson's.    Past Medical History  Diagnosis Date  . Diabetes type 2, controlled   . Hypertension   . Parkinson disease   . GERD (gastroesophageal reflux disease)   . Hyperlipidemia   . Cardiomyopathy   . Pedal edema   . Atrial fibrillation   . Cardiomyopathy   . Dementia     There are no active problems to display for this  patient.   Past Surgical History  Procedure Laterality Date  . Total knee arthroplasty      Right  . Cataract extraction      Bilateral  . Other surgical history      Cyst Removal C7-T1    Current Outpatient Rx  Name  Route  Sig  Dispense  Refill  . atorvastatin (LIPITOR) 20 MG tablet   Oral   Take 20 mg by mouth daily.         . carbidopa-levodopa (SINEMET CR) 50-200 MG per tablet      Take 1 tablet by mouth 4 times a day         . clindamycin (CLINDAGEL) 1 % gel   Topical   Apply topically 2 (two) times daily.         . dabigatran (PRADAXA) 150 MG CAPS capsule   Oral   Take 150 mg by mouth 2 (two) times daily.         Marland Kitchen donepezil (ARICEPT) 5 MG tablet   Oral   Take 5 mg by mouth at bedtime.         . gabapentin (NEURONTIN) 300 MG capsule   Oral   Take 300 mg by mouth 3 (three) times daily.         . hydrochlorothiazide (HYDRODIURIL) 25 MG tablet   Oral   Take 25 mg by mouth 2 (two) times daily.         . insulin glargine (  LANTUS) 100 UNIT/ML injection   Subcutaneous   Inject into the skin at bedtime. 18 units at bedtime         . insulin lispro (HUMALOG KWIKPEN) 100 UNIT/ML KiwkPen   Subcutaneous   Inject into the skin. Sliding Scale         . lisinopril (PRINIVIL,ZESTRIL) 10 MG tablet   Oral   Take 10 mg by mouth daily.         . metoprolol succinate (TOPROL-XL) 50 MG 24 hr tablet   Oral   Take 50 mg by mouth daily. Take with or immediately following a meal.         . potassium chloride (K-DUR) 10 MEQ tablet   Oral   Take 10 mEq by mouth daily.           Allergies Hydrocodone-acetaminophen  No family history on file.  Social History Social History  Substance Use Topics  . Smoking status: Never Smoker   . Smokeless tobacco: Never Used  . Alcohol Use: No     Comment: no EtOH x 60 years    Review of Systems  Constitutional: Negative for fever. ENT: Negative for sore throat. Cardiovascular: Negative for chest  pain. History of A. fib and on anticoagulants. Respiratory: Negative for shortness of breath. Gastrointestinal: Negative for abdominal pain, vomiting and diarrhea. Genitourinary: Negative for dysuria. Musculoskeletal: No myalgias or injuries. Skin: Negative for rash. Neurological: History of Parkinson's. General weakness and ambulatory dysfunction.   10-point ROS otherwise negative.  ____________________________________________   PHYSICAL EXAM:  VITAL SIGNS: ED Triage Vitals  Enc Vitals Group     BP 05/08/15 0947 140/91 mmHg     Pulse Rate 05/08/15 0947 100     Resp 05/08/15 0947 18     Temp 05/08/15 0947 98.1 F (36.7 C)     Temp Source 05/08/15 0947 Oral     SpO2 05/08/15 0947 98 %     Weight 05/08/15 0947 142 lb (64.411 kg)     Height 05/08/15 0947 5\' 10"  (1.778 m)     Head Cir --      Peak Flow --      Pain Score --      Pain Loc --      Pain Edu? --      Excl. in GC? --     Constitutional: Alert, speaks slowly, appears fatigued, but no acute distress. Notable tremor in the right hand. ENT   Head: Normocephalic and atraumatic.   Nose: No congestion/rhinnorhea.   Mouth/Throat: Mucous membranes are moist. Cardiovascular: irregular rhythm, no murmur noted Respiratory:  Normal respiratory effort, no tachypnea.    Breath sounds are clear and equal bilaterally.  Gastrointestinal: Soft and nontender. No distention.  Back: No muscle spasm, no tenderness, no CVA tenderness. Musculoskeletal: No deformity noted. Nontender with normal range of motion in all extremities.  No noted edema. Neurologic:  Alert, slow speech but communicative, notable tremor, especially in the right hand. The patient is able to move all 4 extremities with 4-5 over 5 strength. He does appear to have some global weakness. ____________________________________________    LABS (pertinent positives/negatives)  Labs Reviewed  BASIC METABOLIC PANEL - Abnormal; Notable for the following:     Potassium 3.1 (*)    Chloride 99 (*)    Glucose, Bld 125 (*)    BUN 23 (*)    GFR calc non Af Amer 59 (*)    All other components within normal limits  CBC - Abnormal;  Notable for the following:    WBC 11.2 (*)    All other components within normal limits  URINALYSIS COMPLETEWITH MICROSCOPIC ([facility] ONLY) - Abnormal; Notable for the following:    Color, Urine YELLOW (*)    APPearance CLEAR (*)    Ketones, ur 1+ (*)    Protein, ur 30 (*)    Squamous Epithelial / LPF 0-5 (*)    All other components within normal limits  TROPONIN I     ____________________________________________   EKG  ED ECG REPORT I, Jeanne Diefendorf W, the attending physician, personally viewed and interpreted this ECG.   Date: 05/08/2015  EKG Time: 9:47 AM  Rate: 94  Rhythm:Atrial fibrillation    Axis: Normal  Intervals: Normal  ST&T Change: Nonspecific ST and T-wave abnormalities, including flattened T-wave in lead 3 and aVF. Q wave in lead 3.   ____________________________________________    RADIOLOGY  CT head:  IMPRESSION: Atrophy and moderate small vessel ischemic change. No acute intracranial abnormality.  Chest x-ray: IMPRESSION: 1. Mild to moderate cardiomegaly without evidence of pulmonary edema. 2. Minimal linear atelectasis or scarring in the lingula. No acute cardiopulmonary disease otherwise. ____________________________________________   INITIAL IMPRESSION / ASSESSMENT AND PLAN / ED COURSE  Pertinent labs & imaging results that were available during my care of the patient were reviewed by me and considered in my medical decision making (see chart for details).   81 year old male with Parkinson's disease and likely progression of this disease process. He is having increased difficulty ambulating. He has fallen. The fact that he is on Pradaxa leads to a reasonable head CT, but he has no clear focal change since any specific fall.  The wife tells me at least twice that she is  committed to having the patient return home if at all possible. She does not 1 him placed in a nursing home, even though I suspect that he needs further support and care than what she is capable of comfortably providing at home. We will have a PT OT assessment done for ongoing home support and care.  ----------------------------------------- 2:08 PM on 05/08/2015 -----------------------------------------  Head CT does not show any acute changes. Blood tests are overall stable with a slight elevation of the white blood cell count of 11.2 and a slight decrease from expected of potassium at 3.1.. Urinalysis shows no sign of infection. He does have 1+ ketones noted.  The patient has been treated with 500 mL's of normal saline intravenous.  We will treat him with 20 mEq of potassium.  We have placed a consult for evaluation by occupational therapy/physical therapy area. Their evaluation is pending.  ----------------------------------------- 2:22 PM on 05/08/2015 -----------------------------------------  Reevaluation of the patient finds him in the same condition he arrived in. He appears somewhat weak. I have spoke with the family about providing a dose of potassium here. The wife reports she has potassium for him at home and she prefers to give him that due to the additional charges that, with any dose of medication here. Given that his potassium is 3.1, I think this is reasonable.  We have reviewed the CT scan and other results. The wife is now telling me that the patient has "cold sweats". We will add a chest x-ray to evaluate for possible pneumonia.  If the chest x-ray is normal, we will let him return home. Again, the family and I spoke about the challenges of caring for the patient at home and the possibility of a nursing home stay or rehabilitation  stay. The wife is still committed to not having the patient go to a nursing home and to care for him at home. I advised her for this and I think  this is a reasonable path.  ----------------------------------------- 3:28 PM on 05/08/2015 -----------------------------------------  Chest x-ray does not show any focal infiltrate. We will discharge the patient as planned. ____________________________________________   FINAL CLINICAL IMPRESSION(S) / ED DIAGNOSES  Final diagnoses:  Ambulatory dysfunction  Parkinson's disease   hypokalemia    Darien Ramus, MD 05/08/15 1411  Darien Ramus, MD 05/08/15 [phone]

## 2015-12-28 IMAGING — CT CT HEAD W/O CM
2 series · 14 of 30 positions shown, 16 images · non-contrast
Comparison: CT brain scan of 12/20/2014

CLINICAL DATA: Generalized weakness which has been increasing,
history of Parkinson's disease

EXAM:
CT HEAD WITHOUT CONTRAST
TECHNIQUE: Contiguous axial images were obtained from the base of the skull
through the vertex without intravenous contrast.

[Series 2: head wo · axial · 0.41mm/px · z∈[+301,+401]mm · 6 of 30 slices shown, 8 images]
[im 5/30  brain]
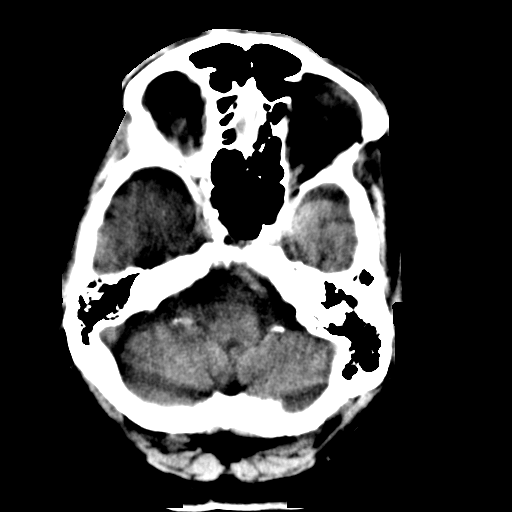
[im 5/30  bone]
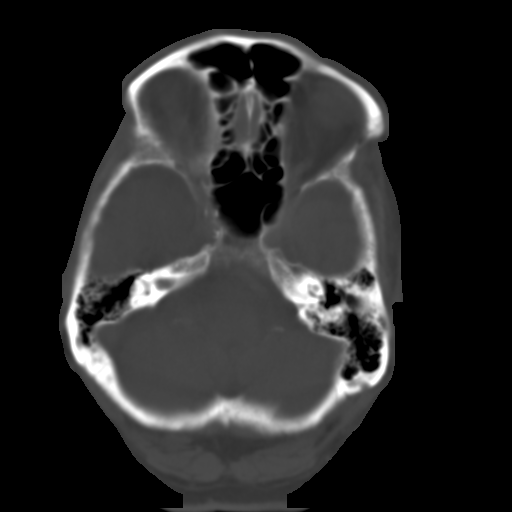
[im 9/30  brain]
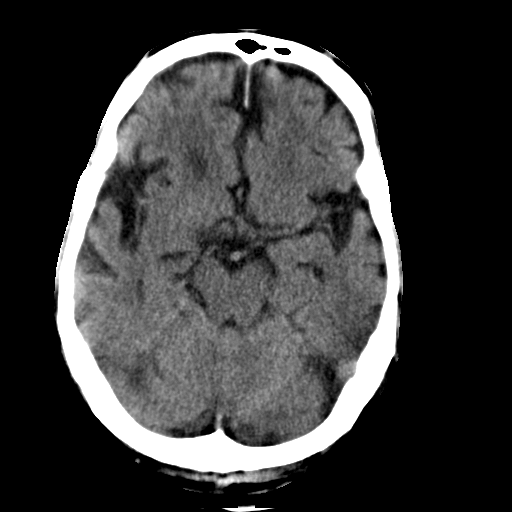
[im 13/30  brain]
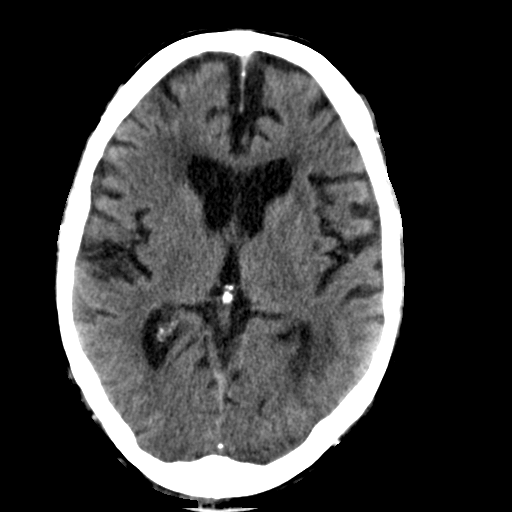
[im 17/30  brain]
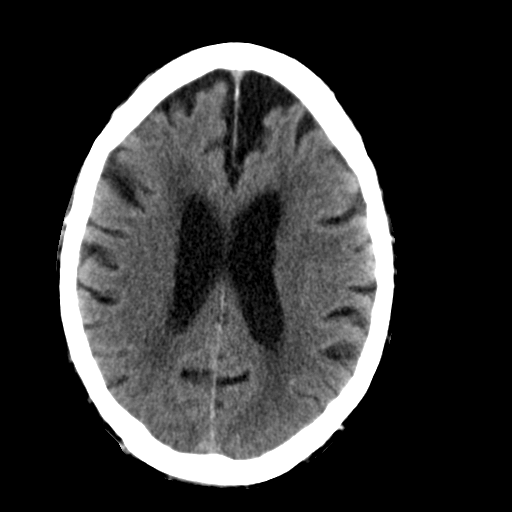
[im 21/30  brain]
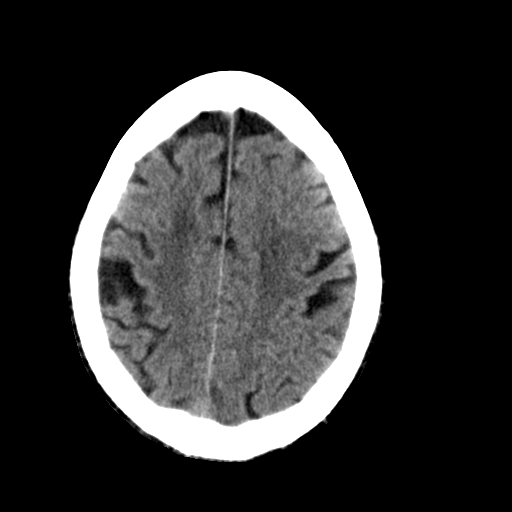
[im 21/30  bone]
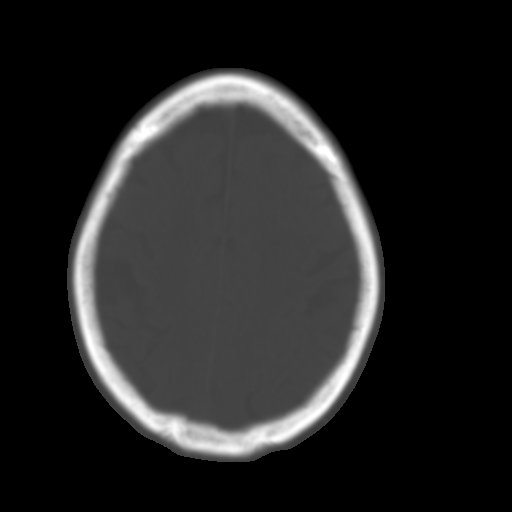
[im 25/30  brain]
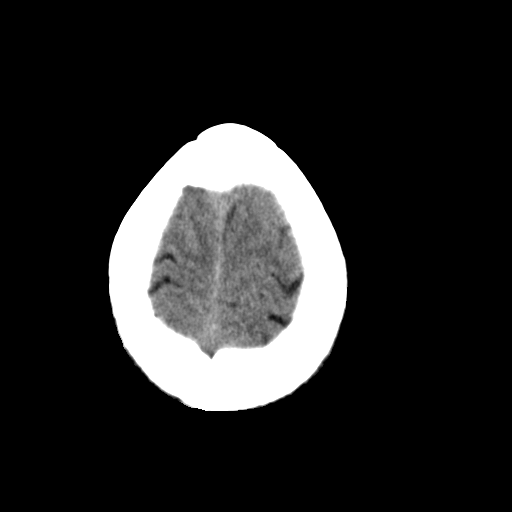

[Series 3: head bone · axial · 0.41mm/px · z∈[+285,+421]mm · 8 of 84 slices shown]
[im 8/84  bone]
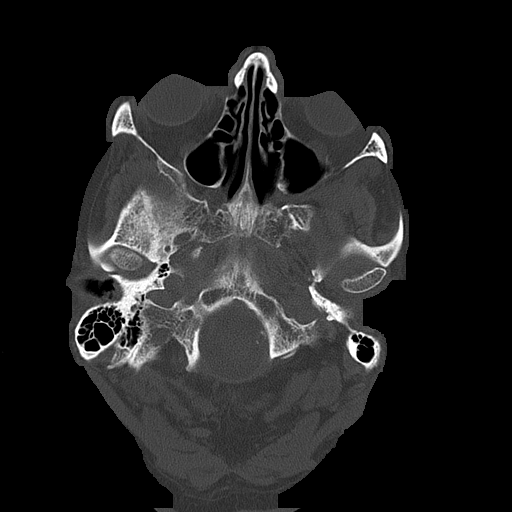
[im 16/84  bone]
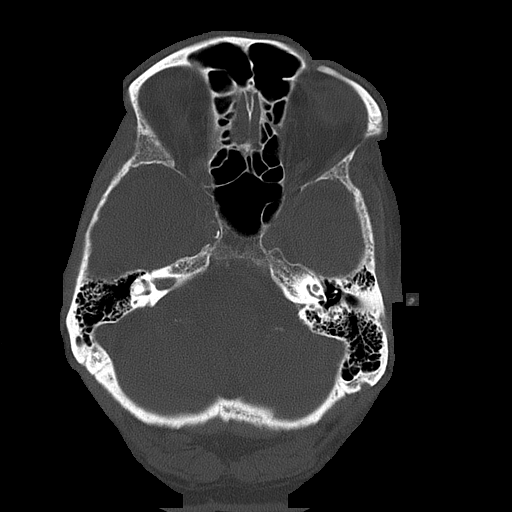
[im 28/84  bone]
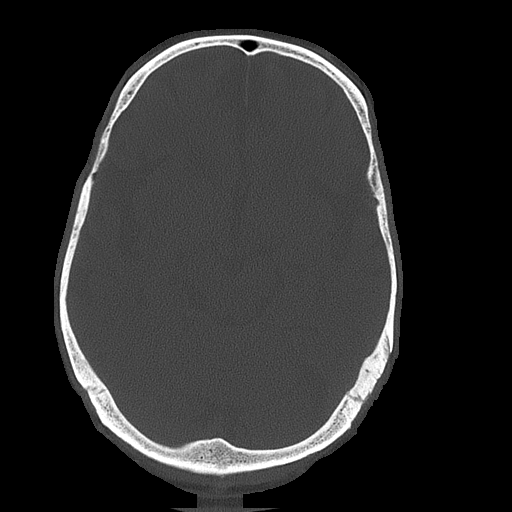
[im 36/84  bone]
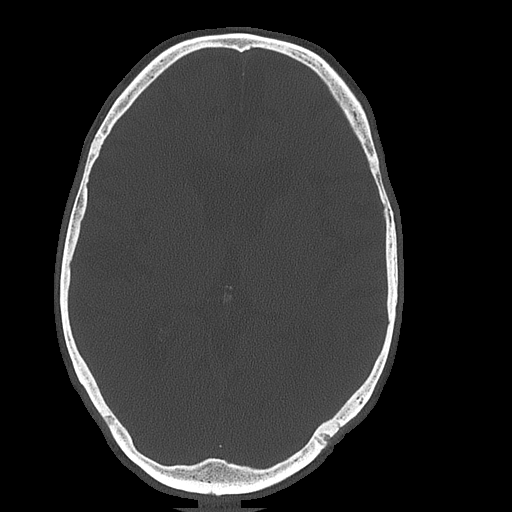
[im 48/84  bone]
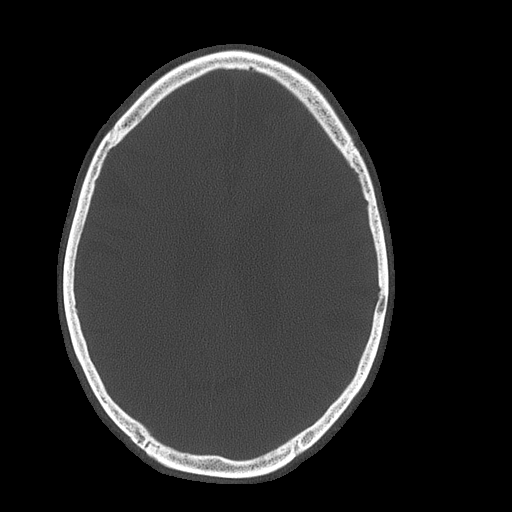
[im 56/84  bone]
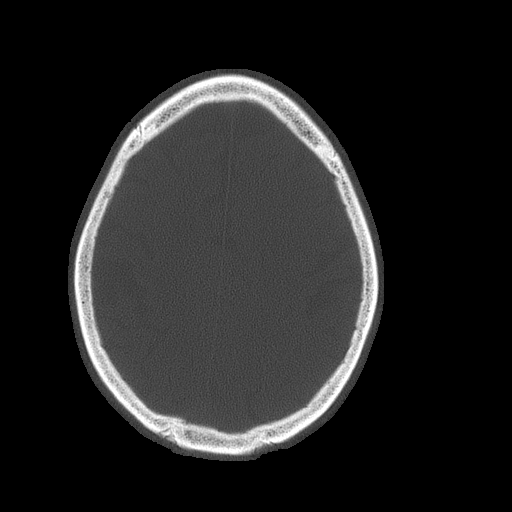
[im 68/84  bone]
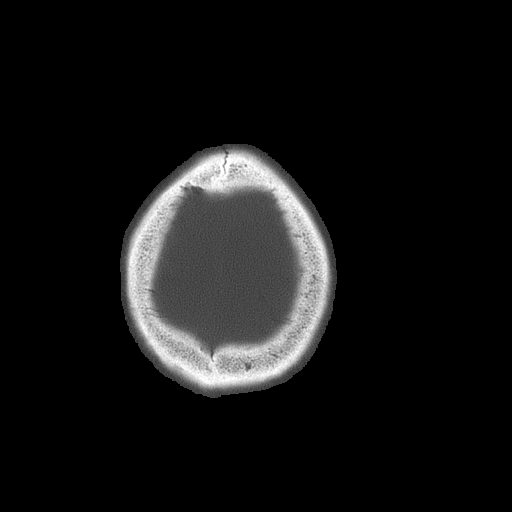
[im 76/84  bone]
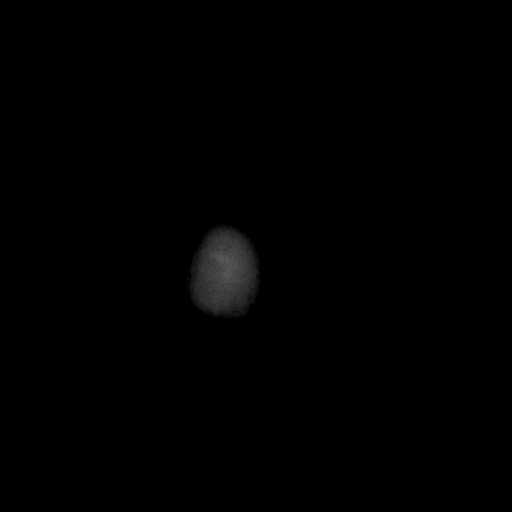

[14 of 30 positions shown; findings below may reference images not displayed]

FINDINGS: The ventricular system remains prominent, as are the cortical sulci,
indicative of diffuse atrophy. Moderate small vessel ischemic change
is again noted throughout the periventricular white matter. The
fourth ventricle and basilar cisterns are unremarkable. No
hemorrhage, mass lesion, or acute infarction is seen. On bone window
images, no calvarial abnormality is seen.
IMPRESSION: Atrophy and moderate small vessel ischemic change. No acute
intracranial abnormality.

## 2016-04-27 DEATH — deceased
